# Patient Record
Sex: Male | Born: 1970 | Race: Black or African American | Hispanic: No | Marital: Single | Smoking: Current every day smoker
Health system: Southern US, Community
[De-identification: ages and names within clinical notes are randomized; demographics above are authoritative.]

## PROBLEM LIST (undated history)

## (undated) DIAGNOSIS — J45909 Unspecified asthma, uncomplicated: Secondary | ICD-10-CM

---

## 2018-12-12 ENCOUNTER — Emergency Department
Admission: EM | Admit: 2018-12-12 | Discharge: 2018-12-12 | Disposition: A | Discharge status: Home / Self Care | Payer: Self-pay | Attending: Emergency Medicine | Admitting: Emergency Medicine

## 2018-12-12 ENCOUNTER — Encounter: Payer: Self-pay | Admitting: Emergency Medicine

## 2018-12-12 ENCOUNTER — Emergency Department: Payer: Self-pay

## 2018-12-12 ENCOUNTER — Other Ambulatory Visit: Payer: Self-pay

## 2018-12-12 DIAGNOSIS — F17200 Nicotine dependence, unspecified, uncomplicated: Secondary | ICD-10-CM | POA: Insufficient documentation

## 2018-12-12 DIAGNOSIS — J4521 Mild intermittent asthma with (acute) exacerbation: Secondary | ICD-10-CM | POA: Insufficient documentation

## 2018-12-12 DIAGNOSIS — Z20828 Contact with and (suspected) exposure to other viral communicable diseases: Secondary | ICD-10-CM | POA: Insufficient documentation

## 2018-12-12 HISTORY — DX: Unspecified asthma, uncomplicated: J45.909

## 2018-12-12 LAB — CBC WITH DIFFERENTIAL/PLATELET
Abs Immature Granulocytes: 0.04 10*3/uL (ref 0.00–0.07)
Basophils Absolute: 0 10*3/uL (ref 0.0–0.1)
Basophils Relative: 0 %
Eosinophils Absolute: 0.1 10*3/uL (ref 0.0–0.5)
Eosinophils Relative: 1 %
HCT: 40.4 % (ref 39.0–52.0)
Hemoglobin: 13.8 g/dL (ref 13.0–17.0)
Immature Granulocytes: 1 %
Lymphocytes Relative: 22 %
Lymphs Abs: 1.8 10*3/uL (ref 0.7–4.0)
MCH: 34.8 pg — ABNORMAL HIGH (ref 26.0–34.0)
MCHC: 34.2 g/dL (ref 30.0–36.0)
MCV: 101.8 fL — ABNORMAL HIGH (ref 80.0–100.0)
Monocytes Absolute: 1.1 10*3/uL — ABNORMAL HIGH (ref 0.1–1.0)
Monocytes Relative: 13 %
Neutro Abs: 5.3 10*3/uL (ref 1.7–7.7)
Neutrophils Relative %: 63 %
Platelets: 223 10*3/uL (ref 150–400)
RBC: 3.97 MIL/uL — ABNORMAL LOW (ref 4.22–5.81)
RDW: 12.9 % (ref 11.5–15.5)
WBC: 8.4 10*3/uL (ref 4.0–10.5)
nRBC: 0 % (ref 0.0–0.2)

## 2018-12-12 LAB — BASIC METABOLIC PANEL
Anion gap: 9 (ref 5–15)
BUN: 8 mg/dL (ref 6–20)
CO2: 24 mmol/L (ref 22–32)
Calcium: 8.6 mg/dL — ABNORMAL LOW (ref 8.9–10.3)
Chloride: 106 mmol/L (ref 98–111)
Creatinine, Ser: 0.92 mg/dL (ref 0.61–1.24)
GFR calc Af Amer: >60 mL/min (ref >60)
GFR calc non Af Amer: >60 mL/min (ref >60)
Glucose, Bld: 98 mg/dL (ref 70–99)
Potassium: 3.8 mmol/L (ref 3.5–5.1)
Sodium: 139 mmol/L (ref 135–145)

## 2018-12-12 MED ORDER — SODIUM CHLORIDE 0.9 % IV BOLUS
1000.0000 mL | Freq: Once | INTRAVENOUS | Status: AC
  Administered 2018-12-12: 1000 mL via INTRAVENOUS

## 2018-12-12 MED ORDER — KETOROLAC TROMETHAMINE 30 MG/ML IJ SOLN
15.0000 mg | INTRAMUSCULAR | Status: AC
  Administered 2018-12-12: 15 mg via INTRAVENOUS
  Filled 2018-12-12: qty 1

## 2018-12-12 MED ORDER — IPRATROPIUM-ALBUTEROL 0.5-2.5 (3) MG/3ML IN SOLN
9.0000 mL | Freq: Once | RESPIRATORY_TRACT | Status: AC
  Administered 2018-12-12: 9 mL via RESPIRATORY_TRACT
  Filled 2018-12-12: qty 3
  Filled 2018-12-12: qty 9

## 2018-12-12 MED ORDER — AZITHROMYCIN 250 MG PO TABS: ORAL_TABLET | ORAL | 0 refills | Status: AC

## 2018-12-12 MED ORDER — METHYLPREDNISOLONE SODIUM SUCC 125 MG IJ SOLR
125.0000 mg | Freq: Once | INTRAMUSCULAR | Status: AC
  Administered 2018-12-12: 09:00:00 125 mg via INTRAVENOUS
  Filled 2018-12-12: qty 2

## 2018-12-12 MED ORDER — ALBUTEROL SULFATE HFA 108 (90 BASE) MCG/ACT IN AERS: 2.0000 | INHALATION_SPRAY | RESPIRATORY_TRACT | 1 refills | Status: AC | PRN

## 2018-12-12 MED ORDER — PREDNISONE 20 MG PO TABS: 40.0000 mg | ORAL_TABLET | Freq: Every day | ORAL | 0 refills | Status: AC

## 2018-12-12 NOTE — ED Triage Notes (Signed)
Pt ems from home for SOB x 3 days with cough. Pt denies fever. Temp here 98.7. pt also with ABD that started 2 days ago. Per pt hx of asthma, not on any meds.

## 2018-12-12 NOTE — ED Provider Notes (Signed)
[facility] Emergency Department Provider Note  ____________________________________________  Time seen: Approximately 8:37 AM  I have reviewed the triage vital signs and the nursing notes.   HISTORY  Chief Complaint Shortness of Breath and Abdominal Pain    HPI Duane Perez is a 48 y.o. male with a history of asthma who complains of shortness of breath with nonproductive cough for the past 3 days associated with wheezing.  No fever or chills but he does have generalized abdominal pain and body aches.  He feels fatigued.  No change in sense of taste or smell.  Symptoms are gradual onset, constant, no aggravating or alleviating factors.  Denies possible exposure to COVID although he works outside at a car wash.      Past Medical History:  Diagnosis Date  . Asthma      There are no active problems to display for this patient.       Prior to Admission medications   Medication Sig Start Date End Date Taking? Authorizing Provider  albuterol (PROVENTIL HFA) 108 (90 Base) MCG/ACT inhaler Inhale 2 puffs into the lungs every 4 (four) hours as needed for wheezing or shortness of breath. 12/12/18   Carrie Mew, MD  azithromycin (ZITHROMAX Z-PAK) 250 MG tablet Take 2 tablets (500 mg) on  Day 1,  followed by 1 tablet (250 mg) once daily on Days 2 through 5. 12/12/18   Carrie Mew, MD  predniSONE (DELTASONE) 20 MG tablet Take 2 tablets (40 mg total) by mouth daily. 12/12/18   Carrie Mew, MD     Allergies Patient has no known allergies.   No family history on file.  Social History Social History   Tobacco Use  . Smoking status: Current Every Day Smoker  . Smokeless tobacco: Never Used  Substance Use Topics  . Alcohol use: Yes  . Drug use: Not on file    Review of Systems  Constitutional:   No fever or chills.  ENT:   No sore throat. No rhinorrhea. Cardiovascular:   No chest pain or syncope. Respiratory:   Positive shortness of  breath and cough. Gastrointestinal:   Positive as above for abdominal pain without vomiting and diarrhea.  Musculoskeletal:   Negative for focal pain or swelling All other systems reviewed and are negative except as documented above in ROS and HPI.  ____________________________________________   PHYSICAL EXAM:  VITAL SIGNS: ED Triage Vitals  Enc Vitals Group     BP 12/12/18 0800 120/86     Pulse Rate 12/12/18 0800 83     Resp 12/12/18 0800 (!) 28     Temp 12/12/18 0804 98.7 F (37.1 C)     Temp Source 12/12/18 0804 Oral     SpO2 12/12/18 0800 93 %     Weight 12/12/18 0806 170 lb (77.1 kg)     Height 12/12/18 0806 6' (1.829 m)     Head Circumference --      Peak Flow --      Pain Score 12/12/18 0805 9     Pain Loc --      Pain Edu? --      Excl. in [place]? --     Vital signs reviewed, nursing assessments reviewed.   Constitutional:   Alert and oriented. Non-toxic appearance. Eyes:   Conjunctivae are normal. EOMI. PERRL. ENT      Head:   Normocephalic and atraumatic.      Nose:   Positive nasal congestion.       Mouth/Throat:  MMM, no pharyngeal erythema. No peritonsillar mass.       Neck:   No meningismus. Full ROM. Hematological/Lymphatic/Immunilogical:   No cervical lymphadenopathy. Cardiovascular:   RRR. Symmetric bilateral radial and DP pulses.  No murmurs. Cap refill less than 2 seconds. Respiratory:   Normal respiratory effort without tachypnea/retractions.  Diffuse expiratory wheezing with prolonged expiratory phase Gastrointestinal:   Soft and nontender. Non distended. There is no CVA tenderness.  No rebound, rigidity, or guarding.  Musculoskeletal:   Normal range of motion in all extremities. No joint effusions.  No lower extremity tenderness.  No edema. Neurologic:   Normal speech and language.  Motor grossly intact. No acute focal neurologic deficits are appreciated.  Skin:    Skin is warm, moist and intact. No rash noted.  No petechiae, purpura, or  bullae.  ____________________________________________    LABS (pertinent positives/negatives) (all labs ordered are listed, but only abnormal results are displayed) Labs Reviewed  CBC WITH DIFFERENTIAL/PLATELET - Abnormal; Notable for the following components:      Result Value   RBC 3.97 (*)    MCV 101.8 (*)    MCH 34.8 (*)    Monocytes Absolute 1.1 (*)    All other components within normal limits  BASIC METABOLIC PANEL - Abnormal; Notable for the following components:   Calcium 8.6 (*)    All other components within normal limits  NOVEL CORONAVIRUS, NAA (HOSPITAL ORDER, SEND-OUT TO REF LAB)   ____________________________________________   EKG  Interpreted by me Sinus rhythm rate of 97, normal axis intervals QRS ST segments and T waves.  ____________________________________________    RADIOLOGY  No results found.  ____________________________________________   PROCEDURES Procedures  ____________________________________________  DIFFERENTIAL DIAGNOSIS   Asthma exacerbation, pneumonia, COVID-19, unspecified viral illness, dehydration, electrolyte abnormality  CLINICAL IMPRESSION / ASSESSMENT AND PLAN / ED COURSE  Medications ordered in the ED: Medications  methylPREDNISolone sodium succinate (SOLU-MEDROL) 125 mg/2 mL injection 125 mg (125 mg Intravenous Given 12/12/18 0855)  ketorolac (TORADOL) 30 MG/ML injection 15 mg (15 mg Intravenous Given 12/12/18 0854)  ipratropium-albuterol (DUONEB) 0.5-2.5 (3) MG/3ML nebulizer solution 9 mL (9 mLs Nebulization Given 12/12/18 0923)  sodium chloride 0.9 % bolus 1,000 mL (0 mLs Intravenous Stopped 12/12/18 1014)    Pertinent labs & imaging results that were available during my care of the patient were reviewed by me and considered in my medical decision making (see chart for details).  Rojelio BrennerKayron Verde was evaluated in Emergency Department on 12/15/2018 for the symptoms described in the history of present illness. He was  evaluated in the context of the global COVID-19 pandemic, which necessitated consideration that the patient might be at risk for infection with the SARS-CoV-2 virus that causes COVID-19. Institutional protocols and algorithms that pertain to the evaluation of patients at risk for COVID-19 are in a state of rapid change based on information released by regulatory bodies including the CDC and federal and state organizations. These policies and algorithms were followed during the patient's care in the ED.   Patient presents with constellation of symptoms including shortness of breath cough body aches fatigue abdominal pain.  Most concerning for influenza-like illness, possibly COVID-19.  Will swab, get x-ray, labs.  Give Solu-Medrol Toradol and duo nebs for supportive care.  Will reassess.  Due to shortage of testing supplies, COVID-19 testing will need to be done through a send out test to [facility]     ____________________________________________   FINAL CLINICAL IMPRESSION(S) / ED DIAGNOSES    Final diagnoses:  Mild intermittent asthma with exacerbation     ED Discharge Orders         Ordered    azithromycin (ZITHROMAX Z-PAK) 250 MG tablet     12/12/18 0938    predniSONE (DELTASONE) 20 MG tablet  Daily     12/12/18 0938    albuterol (PROVENTIL HFA) 108 (90 Base) MCG/ACT inhaler  Every 4 hours PRN     12/12/18 0938          Portions of this note were generated with dragon dictation software. Dictation errors may occur despite best attempts at proofreading.   Sharman CheekStafford, Maliah Pyles, MD 12/15/18 [phone]

## 2018-12-12 NOTE — Discharge Instructions (Signed)
Your chest xray and lab tests were okay, and your symptoms are probably due to an asthma exacerbation.  There is a chance that this is due to a COVID-19 infection, so you should isolate yourself at home until the test result for that is available, which takes 1-2 days.

## 2018-12-13 LAB — NOVEL CORONAVIRUS, NAA (HOSP ORDER, SEND-OUT TO REF LAB; TAT 18-24 HRS)

## 2018-12-15 ENCOUNTER — Telehealth: Payer: Self-pay | Admitting: Emergency Medicine

## 2018-12-15 NOTE — Telephone Encounter (Signed)
Called patient to inform that his covid swab was rejected and will not result.  Left message asking him to call me.

## 2020-10-18 IMAGING — DX PORTABLE CHEST - 1 VIEW
2 series · 2 of 2 positions shown · non-contrast
Comparison: None.

CLINICAL DATA: Shortness of breath with cough.

EXAM:
PORTABLE CHEST 1 VIEW

[chest ap (1 of 2)]
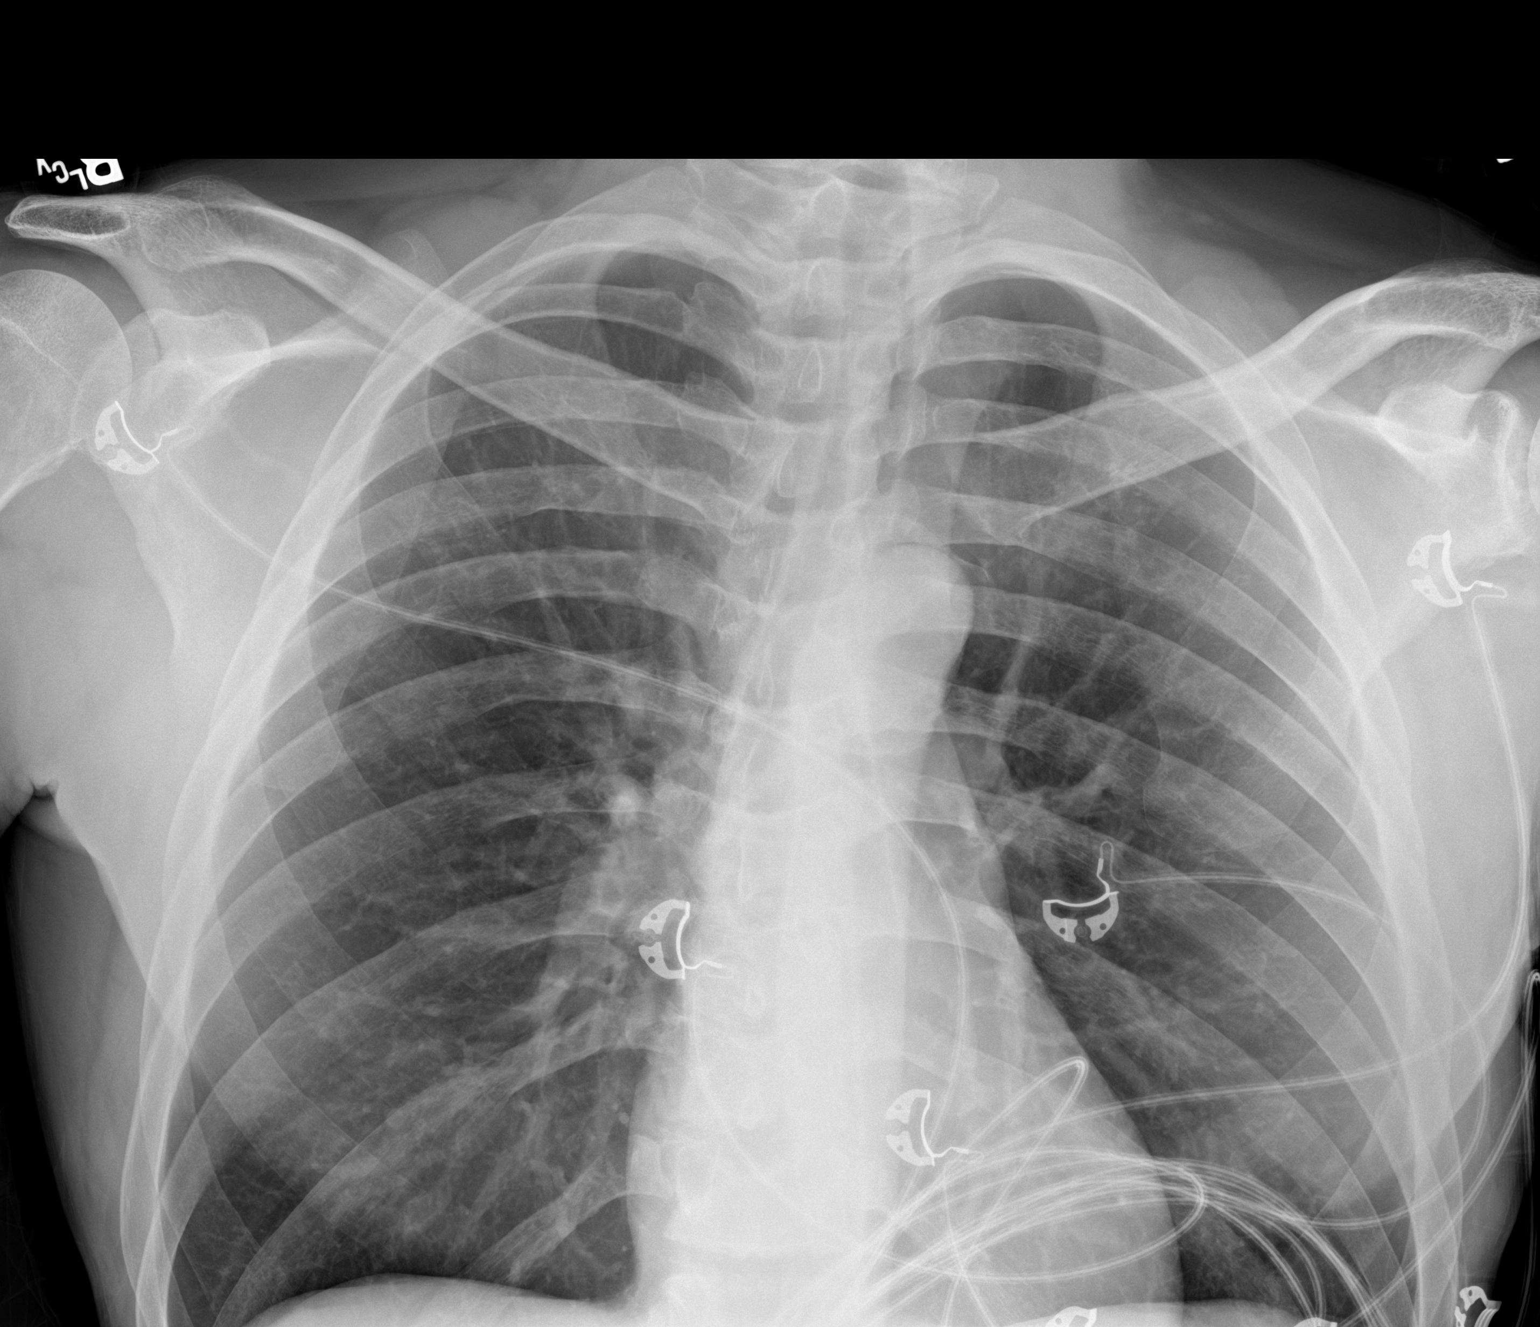

[chest ap (2 of 2)]
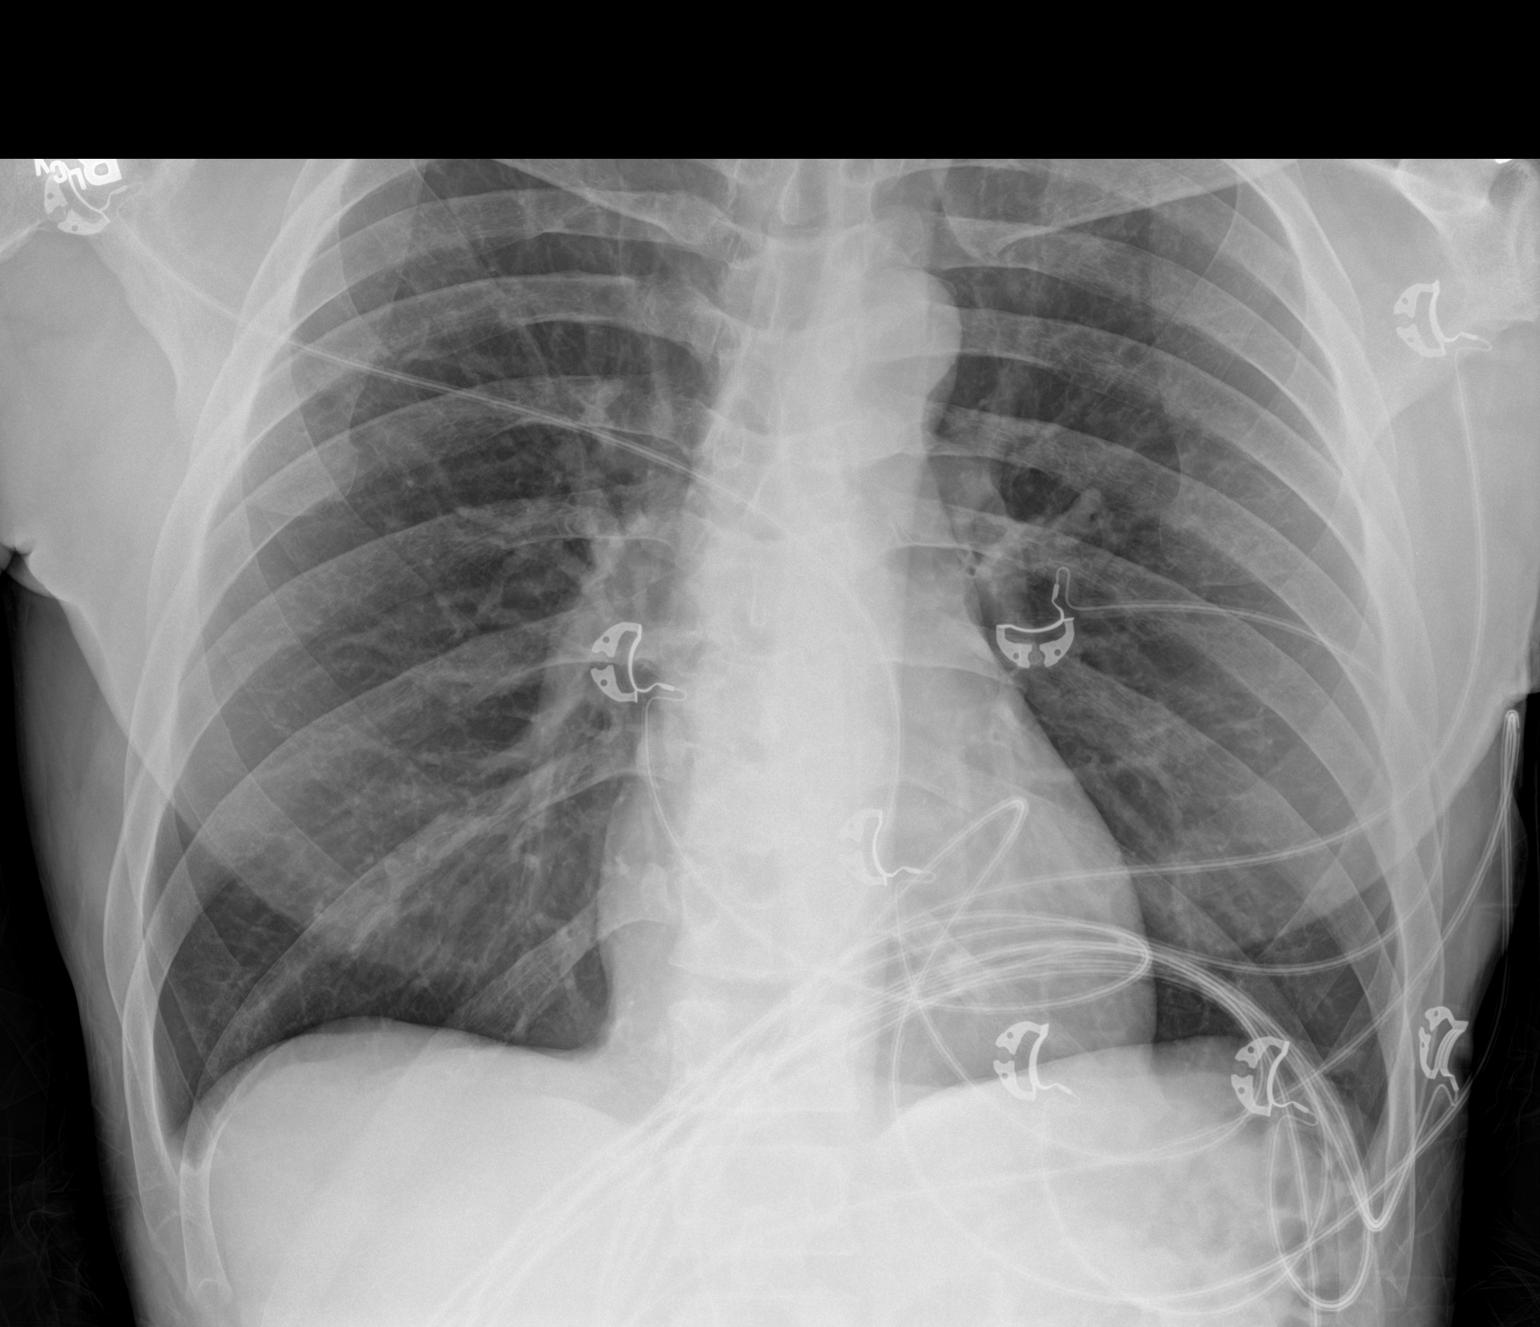

[2 of 2 positions shown; findings below may reference images not displayed]

FINDINGS: Monitoring leads overlie the patient. Normal cardiac and mediastinal
contours. No consolidative pulmonary opacities. No pleural effusion
or pneumothorax.
IMPRESSION: No active disease.
